# Patient Record
Sex: Female | Born: 1955 | Race: Black or African American | Hispanic: No | Marital: Married
Health system: Southern US, Community
[De-identification: ages and names within clinical notes are randomized; demographics above are authoritative.]

---

## 2018-07-11 ENCOUNTER — Emergency Department (HOSPITAL_COMMUNITY): Payer: 59

## 2018-07-11 ENCOUNTER — Emergency Department (HOSPITAL_COMMUNITY)
Admission: EM | Admit: 2018-07-11 | Discharge: 2018-07-11 | Disposition: A | Discharge status: Home / Self Care | Payer: 59 | Attending: Emergency Medicine | Admitting: Emergency Medicine

## 2018-07-11 DIAGNOSIS — Y939 Activity, unspecified: Secondary | ICD-10-CM | POA: Diagnosis not present

## 2018-07-11 DIAGNOSIS — S62615A Displaced fracture of proximal phalanx of left ring finger, initial encounter for closed fracture: Secondary | ICD-10-CM | POA: Diagnosis not present

## 2018-07-11 DIAGNOSIS — Y999 Unspecified external cause status: Secondary | ICD-10-CM | POA: Diagnosis not present

## 2018-07-11 DIAGNOSIS — Z79899 Other long term (current) drug therapy: Secondary | ICD-10-CM | POA: Diagnosis not present

## 2018-07-11 DIAGNOSIS — Y929 Unspecified place or not applicable: Secondary | ICD-10-CM | POA: Diagnosis not present

## 2018-07-11 DIAGNOSIS — W19XXXA Unspecified fall, initial encounter: Secondary | ICD-10-CM | POA: Insufficient documentation

## 2018-07-11 DIAGNOSIS — S6992XA Unspecified injury of left wrist, hand and finger(s), initial encounter: Secondary | ICD-10-CM | POA: Diagnosis present

## 2018-07-11 MED ORDER — ONDANSETRON 4 MG PO TBDP
4.0000 mg | ORAL_TABLET | Freq: Once | ORAL | Status: AC
  Administered 2018-07-11: 4 mg via ORAL
  Filled 2018-07-11: qty 1

## 2018-07-11 MED ORDER — HYDROMORPHONE HCL 1 MG/ML IJ SOLN
1.0000 mg | Freq: Once | INTRAMUSCULAR | Status: AC
  Administered 2018-07-11: 1 mg via INTRAMUSCULAR
  Filled 2018-07-11: qty 1

## 2018-07-11 MED ORDER — OXYCODONE-ACETAMINOPHEN 5-325 MG PO TABS: 1.0000 | ORAL_TABLET | ORAL | 0 refills | Status: AC | PRN

## 2018-07-11 MED ORDER — KETOROLAC TROMETHAMINE 15 MG/ML IJ SOLN
15.0000 mg | Freq: Once | INTRAMUSCULAR | Status: AC
  Administered 2018-07-11: 15 mg via INTRAMUSCULAR
  Filled 2018-07-11: qty 1

## 2018-07-11 MED ORDER — ONDANSETRON 4 MG PO TBDP: 4.0000 mg | ORAL_TABLET | Freq: Once | ORAL | Status: DC

## 2018-07-11 MED ORDER — LORAZEPAM 1 MG PO TABS
1.0000 mg | ORAL_TABLET | Freq: Once | ORAL | Status: AC
  Administered 2018-07-11: 1 mg via ORAL
  Filled 2018-07-11: qty 1

## 2018-07-11 NOTE — ED Notes (Signed)
Ortho tech at bedside applying splint. 

## 2018-07-11 NOTE — ED Triage Notes (Signed)
Pt. Fell and injured her lt. Ring finger.  Appears dislocated and deformity noted.  Ice placed on finger.

## 2018-07-11 NOTE — Discharge Instructions (Signed)
You need to follow-up with hand surgery this upcoming week. Take 600 mg of ibuprofen every 6 hours as needed for pain. Take percocet for breakthrough pain. Keep hand elevated the best you can. The sling is as needed if it's more comfortable while you are splinted. No activity with your L hand.

## 2018-07-15 NOTE — ED Notes (Signed)
Returned the call to [facility] to verify, RX.  RX verified all questions answered. 07/15/2018

## 2018-07-22 NOTE — ED Provider Notes (Signed)
[facility] Provider Note   CSN: [id number] Arrival date & time: 07/11/18  1859     History   Chief Complaint Chief Complaint  Patient presents with  . Finger Injury    HPI Brenda Turner is a 62 y.o. female.  HPI   62yF with L index finger pain. Lost balance and fell prior to arrival. Pain/deformity since. Denies any other injuries. No numbness/tingling. No interventions prior to arrival.  No past medical history on file.  There are no active problems to display for this patient.    OB History   No obstetric history on file.      Home Medications    Prior to Admission medications   Medication Sig Start Date End Date Taking? Authorizing Provider  amLODipine-benazepril (LOTREL) 5-20 MG capsule Take 1 capsule by mouth daily.   Yes [provider]  atenolol (TENORMIN) 50 MG tablet Take 50 mg by mouth daily.   Yes [provider]  folic acid (FOLVITE) 1 MG tablet Take 1 mg by mouth daily.   Yes [provider]  HYDROcodone-acetaminophen (NORCO) 7.5-325 MG tablet Take 1 tablet by mouth every 4 (four) hours as needed for severe pain. 10/16/14  Yes [provider]  hydroxyurea (HYDREA) 500 MG capsule Take 500-1,000 mg by mouth See admin instructions. 500mg  on MON TUE WED THUR and 1000mg  on FRI SAT SUN   Yes [provider]  ibuprofen (ADVIL,MOTRIN) 800 MG tablet Take 800 mg by mouth 3 (three) times daily as needed for pain. 02/11/15  Yes [provider]  Multiple Vitamins-Minerals (WOMENS 50+ MULTI VITAMIN/MIN) TABS Take 1 tablet by mouth daily.   Yes [provider]  Omega-3 Fatty Acids (FISH OIL) 1200 MG CPDR Take 1,200 mg by mouth daily.   Yes [provider]  omeprazole (PRILOSEC) 40 MG capsule Take 40 mg by mouth daily.   Yes [provider]  polyethylene glycol powder (GLYCOLAX/MIRALAX) powder Take 17 g by mouth daily as needed for mild constipation.   Yes  [provider]  promethazine (PHENERGAN) 25 MG tablet Take 25 mg by mouth every 6 (six) hours as needed for nausea/vomiting. 02/11/15  Yes [provider]  oxyCODONE-acetaminophen (PERCOCET/ROXICET) 5-325 MG tablet Take 1 tablet by mouth every 4 (four) hours as needed for severe pain. 07/11/18   Raeford RazorKohut, Dayana Dalporto, MD    Family History No family history on file.  Social History Social History   Tobacco Use  . Smoking status: Not on file  Substance Use Topics  . Alcohol use: Not on file  . Drug use: Not on file     Allergies   Patient has no known allergies.   Review of Systems Review of Systems  All systems reviewed and negative, other than as noted in HPI.  Physical Exam Updated Vital Signs BP (!) 158/82 (BP Location: Right Arm)   Pulse 84   Temp 98.9 F (37.2 C) (Oral)   Resp 18   SpO2 99%   Physical Exam Vitals signs and nursing note reviewed.  Constitutional:      General: She is not in acute distress.    Appearance: She is well-developed.  HENT:     Head: Normocephalic and atraumatic.  Eyes:     General:        Right eye: No discharge.        Left eye: No discharge.     Conjunctiva/sclera: Conjunctivae normal.  Neck:     Musculoskeletal: Neck  supple.  Cardiovascular:     Rate and Rhythm: Normal rate and regular rhythm.     Heart sounds: Normal heart sounds. No murmur. No friction rub. No gallop.   Pulmonary:     Effort: Pulmonary effort is normal. No respiratory distress.     Breath sounds: Normal breath sounds.  Abdominal:     General: There is no distension.     Palpations: Abdomen is soft.     Tenderness: There is no abdominal tenderness.  Musculoskeletal:        General: Tenderness and signs of injury present.     Comments: Ttp, swelling, deformity. Proximal L ring finger. Cannot range 2/2 pain. Sensation intact to light touch. Closed injury.   Skin:    General: Skin is warm and dry.  Neurological:     Mental Status: She is  alert.  Psychiatric:        Behavior: Behavior normal.        Thought Content: Thought content normal.      ED Treatments / Results  Labs (all labs ordered are listed, but only abnormal results are displayed) Labs Reviewed - No data to display  EKG None  Radiology No results found.   Dg Finger Ring Left  Result Date: 07/11/2018 CLINICAL DATA:  Left ring finger pain due to an injury suffered in a fall down stairs today. Initial encounter. EXAM: LEFT RING FINGER 2+V COMPARISON:  None. FINDINGS: The patient has a mildly comminuted fracture through the proximal metaphysis of the proximal phalanx of the left ring finger. The distal fragment demonstrates mild ulnar displacement and dorsal angulation. No other acute abnormality is identified. IMPRESSION: Acute fracture proximal metaphysis proximal phalanx left ring finger as described. Electronically Signed   By: Drusilla Kannerhomas  Dalessio M.D.   On: 07/11/2018 21:22    Procedures Procedures (including critical care time)  Medications Ordered in ED Medications  HYDROmorphone (DILAUDID) injection 1 mg (1 mg Intramuscular Given 07/11/18 2257)  ketorolac (TORADOL) 15 MG/ML injection 15 mg (15 mg Intramuscular Given 07/11/18 2257)  LORazepam (ATIVAN) tablet 1 mg (1 mg Oral Given 07/11/18 2258)  ondansetron (ZOFRAN-ODT) disintegrating tablet 4 mg (4 mg Oral Given 07/11/18 2258)     Initial Impression / Assessment and Plan / ED Course  I have reviewed the triage vital signs and the nursing notes.  Pertinent labs & imaging results that were available during my care of the patient were reviewed by me and considered in my medical decision making (see chart for details).     Proximal phalanx fx. Closed. NVI. Splint. PRN pain meds. Close hand surgery FU. [place] says [place] is from [place] and will be following up there.   Final Clinical Impressions(s) / ED Diagnoses   Final diagnoses:  Closed displaced fracture of proximal phalanx of left ring  finger, initial encounter    ED Discharge Orders         Ordered    oxyCODONE-acetaminophen (PERCOCET/ROXICET) 5-325 MG tablet  Every 4 hours PRN     07/11/18 2302           Raeford RazorKohut, Angelicia Lessner, MD 07/22/18 1836

## 2020-01-09 IMAGING — CR DG FINGER RING 2+V*L*
3 series · 3 of 3 positions shown · non-contrast
Comparison: None.

CLINICAL DATA: Left ring finger pain due to an injury suffered in a
fall down stairs today. Initial encounter.

EXAM:
LEFT RING FINGER 2+V

[finger ap]
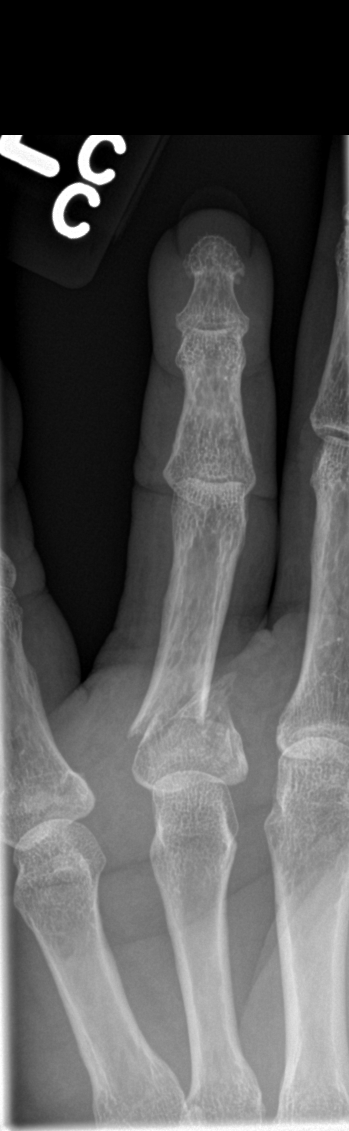

[finger obl]
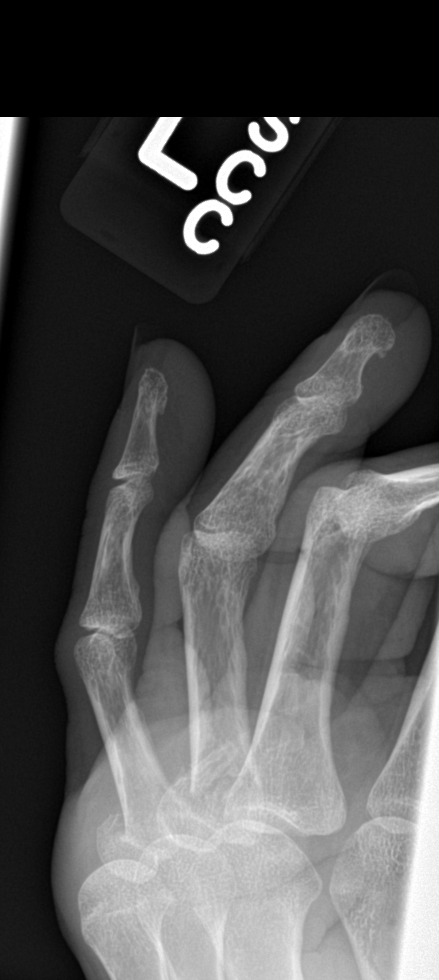

[finger lat]
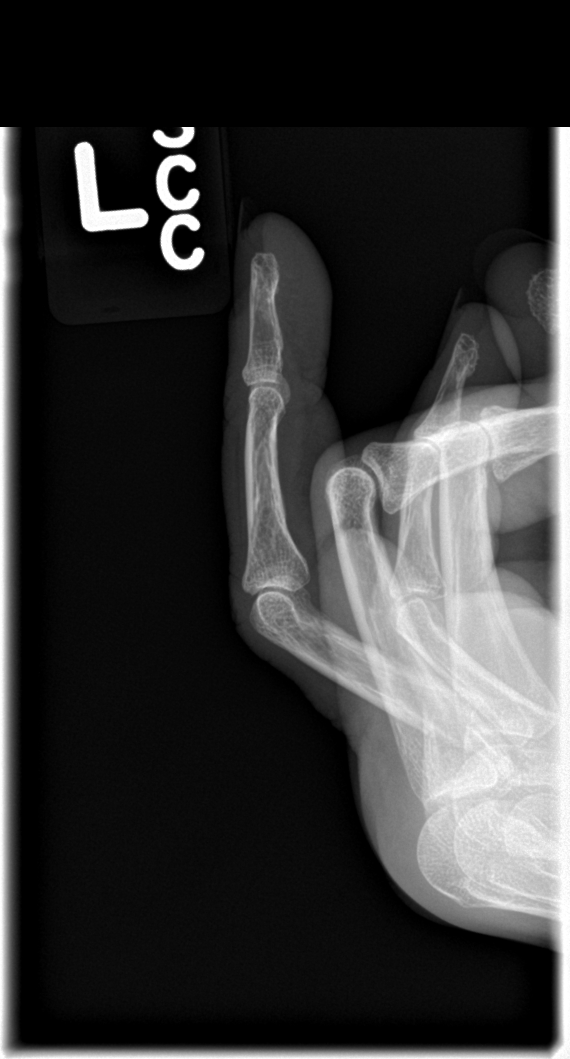

[3 of 3 positions shown; findings below may reference images not displayed]

FINDINGS: The patient has a mildly comminuted fracture through the proximal
metaphysis of the proximal phalanx of the left ring finger. The
distal fragment demonstrates mild ulnar displacement and dorsal
angulation. No other acute abnormality is identified.
IMPRESSION: Acute fracture proximal metaphysis proximal phalanx left ring finger
as described.
